# Patient Record
Sex: Male | Born: 1960 | Race: White | Hispanic: No | Marital: Single | Smoking: Former smoker
Health system: Southern US, Community
[De-identification: ages and names within clinical notes are randomized; demographics above are authoritative.]

---

## 2017-12-20 ENCOUNTER — Emergency Department (HOSPITAL_COMMUNITY)
Admission: EM | Admit: 2017-12-20 | Discharge: 2017-12-21 | Disposition: A | Discharge status: Home / Self Care | Payer: Managed Care, Other (non HMO) | Attending: Emergency Medicine | Admitting: Emergency Medicine

## 2017-12-20 ENCOUNTER — Encounter (HOSPITAL_COMMUNITY): Payer: Self-pay | Admitting: Emergency Medicine

## 2017-12-20 ENCOUNTER — Emergency Department (HOSPITAL_COMMUNITY): Payer: Managed Care, Other (non HMO)

## 2017-12-20 DIAGNOSIS — Y999 Unspecified external cause status: Secondary | ICD-10-CM | POA: Insufficient documentation

## 2017-12-20 DIAGNOSIS — Z87891 Personal history of nicotine dependence: Secondary | ICD-10-CM | POA: Insufficient documentation

## 2017-12-20 DIAGNOSIS — Z79899 Other long term (current) drug therapy: Secondary | ICD-10-CM | POA: Insufficient documentation

## 2017-12-20 DIAGNOSIS — S299XXA Unspecified injury of thorax, initial encounter: Secondary | ICD-10-CM | POA: Diagnosis not present

## 2017-12-20 DIAGNOSIS — Y9389 Activity, other specified: Secondary | ICD-10-CM | POA: Insufficient documentation

## 2017-12-20 DIAGNOSIS — S022XXB Fracture of nasal bones, initial encounter for open fracture: Secondary | ICD-10-CM | POA: Diagnosis not present

## 2017-12-20 DIAGNOSIS — S01511A Laceration without foreign body of lip, initial encounter: Secondary | ICD-10-CM | POA: Insufficient documentation

## 2017-12-20 DIAGNOSIS — S3991XA Unspecified injury of abdomen, initial encounter: Secondary | ICD-10-CM | POA: Insufficient documentation

## 2017-12-20 DIAGNOSIS — S0990XA Unspecified injury of head, initial encounter: Secondary | ICD-10-CM | POA: Diagnosis present

## 2017-12-20 DIAGNOSIS — Y929 Unspecified place or not applicable: Secondary | ICD-10-CM | POA: Diagnosis not present

## 2017-12-20 DIAGNOSIS — S0181XA Laceration without foreign body of other part of head, initial encounter: Secondary | ICD-10-CM | POA: Diagnosis not present
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
## 2017-12-21 ENCOUNTER — Emergency Department (HOSPITAL_COMMUNITY): Payer: Managed Care, Other (non HMO)

## 2017-12-21 LAB — URINALYSIS, ROUTINE W REFLEX MICROSCOPIC
Bilirubin Urine: NEGATIVE
Glucose, UA: NEGATIVE mg/dL
Hgb urine dipstick: NEGATIVE
Ketones, ur: NEGATIVE mg/dL
Leukocytes, UA: NEGATIVE
Nitrite: NEGATIVE
Protein, ur: NEGATIVE mg/dL
Specific Gravity, Urine: >1.046 — ABNORMAL HIGH (ref 1.005–1.030)
pH: 7 (ref 5.0–8.0)

## 2017-12-21 LAB — CBC
HCT: 49.1 % (ref 39.0–52.0)
Hemoglobin: 16.9 g/dL (ref 13.0–17.0)
MCH: 29.9 pg (ref 26.0–34.0)
MCHC: 34.4 g/dL (ref 30.0–36.0)
MCV: 86.7 fL (ref 78.0–100.0)
Platelets: 225 10*3/uL (ref 150–400)
RBC: 5.66 MIL/uL (ref 4.22–5.81)
RDW: 12.1 % (ref 11.5–15.5)
WBC: 14.9 10*3/uL — ABNORMAL HIGH (ref 4.0–10.5)

## 2017-12-21 LAB — I-STAT CHEM 8, ED
BUN: 13 mg/dL (ref 6–20)
Calcium, Ion: 1.09 mmol/L — ABNORMAL LOW (ref 1.15–1.40)
Chloride: 102 mmol/L (ref 98–111)
Creatinine, Ser: 1 mg/dL (ref 0.61–1.24)
Glucose, Bld: 110 mg/dL — ABNORMAL HIGH (ref 70–99)
HCT: 47 % (ref 39.0–52.0)
Hemoglobin: 16 g/dL (ref 13.0–17.0)
Potassium: 3.4 mmol/L — ABNORMAL LOW (ref 3.5–5.1)
Sodium: 142 mmol/L (ref 135–145)
TCO2: 28 mmol/L (ref 22–32)

## 2017-12-21 LAB — COMPREHENSIVE METABOLIC PANEL
ALT: 20 U/L (ref 0–44)
AST: 22 U/L (ref 15–41)
Albumin: 4.1 g/dL (ref 3.5–5.0)
Alkaline Phosphatase: 79 U/L (ref 38–126)
Anion gap: 9 (ref 5–15)
BUN: 11 mg/dL (ref 6–20)
CO2: 28 mmol/L (ref 22–32)
Calcium: 9.4 mg/dL (ref 8.9–10.3)
Chloride: 103 mmol/L (ref 98–111)
Creatinine, Ser: 1.05 mg/dL (ref 0.61–1.24)
GFR calc Af Amer: >60 mL/min (ref >60)
GFR calc non Af Amer: >60 mL/min (ref >60)
Glucose, Bld: 111 mg/dL — ABNORMAL HIGH (ref 70–99)
Potassium: 3.4 mmol/L — ABNORMAL LOW (ref 3.5–5.1)
Sodium: 140 mmol/L (ref 135–145)
Total Bilirubin: 0.8 mg/dL (ref 0.3–1.2)
Total Protein: 7.1 g/dL (ref 6.5–8.1)

## 2017-12-21 LAB — SAMPLE TO BLOOD BANK

## 2017-12-21 LAB — PROTIME-INR
INR: 1.07
Prothrombin Time: 13.8 seconds (ref 11.4–15.2)

## 2017-12-21 LAB — I-STAT CG4 LACTIC ACID, ED: Lactic Acid, Venous: 1.96 mmol/L — ABNORMAL HIGH (ref 0.5–1.9)

## 2017-12-21 LAB — ETHANOL: Alcohol, Ethyl (B): <10 mg/dL (ref <10)

## 2017-12-21 LAB — CDS SEROLOGY

## 2017-12-21 MED ORDER — HYDROCODONE-ACETAMINOPHEN 5-325 MG PO TABS
2.0000 | ORAL_TABLET | Freq: Once | ORAL | Status: AC
  Administered 2017-12-21: 2 via ORAL
  Filled 2017-12-21: qty 2

## 2017-12-21 MED ORDER — IBUPROFEN 600 MG PO TABS: 600.0000 mg | ORAL_TABLET | Freq: Four times a day (QID) | ORAL | 0 refills | Status: AC | PRN

## 2017-12-21 MED ORDER — CEFAZOLIN SODIUM-DEXTROSE 1-4 GM/50ML-% IV SOLN
1.0000 g | Freq: Once | INTRAVENOUS | Status: AC
  Administered 2017-12-21: 1 g via INTRAVENOUS
  Filled 2017-12-21: qty 50

## 2017-12-21 MED ORDER — SALINE SPRAY 0.65 % NA SOLN: 1.0000 | NASAL | 0 refills | Status: AC | PRN

## 2017-12-21 MED ORDER — HYDROCODONE-ACETAMINOPHEN 5-325 MG PO TABS: 1.0000 | ORAL_TABLET | Freq: Four times a day (QID) | ORAL | 0 refills | Status: AC | PRN

## 2017-12-21 MED ORDER — CEPHALEXIN 500 MG PO CAPS: 500.0000 mg | ORAL_CAPSULE | Freq: Four times a day (QID) | ORAL | 0 refills | Status: AC

## 2017-12-21 MED ORDER — METHOCARBAMOL 500 MG PO TABS: 500.0000 mg | ORAL_TABLET | Freq: Two times a day (BID) | ORAL | 0 refills | Status: AC

## 2017-12-21 MED ORDER — IOHEXOL 300 MG/ML  SOLN
100.0000 mL | Freq: Once | INTRAMUSCULAR | Status: AC | PRN
  Administered 2017-12-21: 100 mL via INTRAVENOUS

## 2017-12-21 MED ORDER — LIDOCAINE HCL (PF) 1 % IJ SOLN
30.0000 mL | Freq: Once | INTRAMUSCULAR | Status: AC
Start: 2017-12-21 — End: 2017-12-21
  Administered 2017-12-21: 30 mL
  Filled 2017-12-21: qty 30
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
## 2017-12-21 NOTE — Progress Notes (Signed)
Orthopedic Tech Progress Note Patient Details:  Jerome Romero 07/30/1960 [id number]  Ortho Devices Type of Ortho Device: Ace wrap, Short arm splint, Arm sling Ortho Device/Splint Interventions: Application   Post Interventions Patient Tolerated: Well Instructions Provided: Care of device   Saul FordyceJennifer C Evalynn Hankins 12/21/2017, 7:39 AM
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
## 2017-12-21 NOTE — ED Notes (Signed)
Notified Dr. Anitra LauthPlunkett of pt's critical labs K 3.4 and Lactic 1.96
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
